# Patient Record
Sex: Female | Born: 1980 | Hispanic: No | Marital: Single | Smoking: Never smoker
Health system: Southern US, Community
[De-identification: ages and names within clinical notes are randomized; demographics above are authoritative.]

## PROBLEM LIST (undated history)

## (undated) ENCOUNTER — Encounter

## (undated) DIAGNOSIS — D649 Anemia, unspecified: Secondary | ICD-10-CM

## (undated) DIAGNOSIS — K219 Gastro-esophageal reflux disease without esophagitis: Secondary | ICD-10-CM

## (undated) HISTORY — PX: HERNIA REPAIR: SHX51

## (undated) HISTORY — DX: Gastro-esophageal reflux disease without esophagitis: K21.9

## (undated) HISTORY — DX: Anemia, unspecified: D64.9

---

## 2015-11-07 DIAGNOSIS — M542 Cervicalgia: Secondary | ICD-10-CM

## 2015-11-07 DIAGNOSIS — M79602 Pain in left arm: Secondary | ICD-10-CM

## 2015-11-07 DIAGNOSIS — H9202 Otalgia, left ear: Secondary | ICD-10-CM

## 2015-11-07 DIAGNOSIS — R202 Paresthesia of skin: Secondary | ICD-10-CM

## 2015-11-07 DIAGNOSIS — J029 Acute pharyngitis, unspecified: Secondary | ICD-10-CM

## 2015-11-07 DIAGNOSIS — M62838 Other muscle spasm: Principal | ICD-10-CM

## 2015-11-07 DIAGNOSIS — Z538 Procedure and treatment not carried out for other reasons: Secondary | ICD-10-CM

## 2015-11-07 DIAGNOSIS — K429 Umbilical hernia without obstruction or gangrene: Principal | ICD-10-CM

## 2015-11-08 ENCOUNTER — Inpatient Hospital Stay: Admit: 2015-11-08 | Discharge: 2015-11-08

## 2015-11-08 DIAGNOSIS — R202 Paresthesia of skin: Secondary | ICD-10-CM

## 2015-11-08 DIAGNOSIS — M542 Cervicalgia: Secondary | ICD-10-CM

## 2015-11-08 DIAGNOSIS — Z538 Procedure and treatment not carried out for other reasons: Secondary | ICD-10-CM

## 2015-11-08 DIAGNOSIS — H9202 Otalgia, left ear: Secondary | ICD-10-CM

## 2015-11-08 DIAGNOSIS — M62838 Other muscle spasm: Principal | ICD-10-CM

## 2015-11-08 DIAGNOSIS — M79602 Pain in left arm: Secondary | ICD-10-CM

## 2015-11-08 DIAGNOSIS — J029 Acute pharyngitis, unspecified: Secondary | ICD-10-CM

## 2015-11-08 DIAGNOSIS — K429 Umbilical hernia without obstruction or gangrene: Principal | ICD-10-CM

## 2015-11-08 MED ORDER — DIAZEPAM 5 MG PO TABS
5 mg | Freq: Every evening | ORAL | 0 refills | Status: CP | PRN
Start: 2015-11-08 — End: ?

## 2015-11-08 MED ORDER — IBUPROFEN 800 MG PO TABS
Freq: Four times a day (QID) | ORAL | PRN
Start: 2015-11-08 — End: 2015-11-08

## 2015-11-08 MED ORDER — IBUPROFEN 400 MG PO TABS
400 mg | Freq: Four times a day (QID) | ORAL | 0 refills | Status: CP | PRN
Start: 2015-11-08 — End: ?

## 2015-11-08 NOTE — ED Provider Notes
History     Chief Complaint   Patient presents with   ? Neck Pain   ? Facial Pain       The history is provided by the patient.       No Known Allergies    Patient's Medications   New Prescriptions    No medications on file   Previous Medications    IBUPROFEN (ADVIL,MOTRIN) 800 MG TABLET    Take by mouth every 6 hours as needed for pain.    ONDANSETRON (ZOFRAN) 4 MG TABLET    Take 1 tablet by mouth every 8 hours as needed.   Modified Medications    No medications on file   Discontinued Medications    No medications on file       Past Medical History:   Diagnosis Date   ? Umbilical hernia        Past Surgical History:   Procedure Laterality Date   ? CESAREAN SECTION     ? HERNIA REPAIR         No family history on file.    Social History     Social History   ? Marital status: Single     Spouse name: N/A   ? Number of children: N/A   ? Years of education: N/A     Social History Main Topics   ? Smoking status: Never Smoker   ? Smokeless tobacco: None   ? Alcohol use Yes   ? Drug use: No   ? Sexual activity: Not Asked     Other Topics Concern   ? None     Social History Narrative       Review of Systems    Physical Exam     ED Triage Vitals   BP 11/08/15 1146 122/83   Pulse 11/08/15 1146 72   Resp 11/08/15 1146 16   Temp 11/08/15 1146 36.6 ?C (97.9 ?F)   Temp src 11/08/15 1146 Oral   Height 11/08/15 1146 1.803 m   Weight 11/08/15 1146 85.7 kg   SpO2 11/08/15 1146 97 %   BMI (Calculated) 11/08/15 1146 26.42       BP 122/83 - Pulse 72 - Temp 36.6 ?C (97.9 ?F) (Oral)  - Resp 16 - Ht 1.803 m - Wt 85.7 kg - SpO2 97% - BMI 26.36 kg/m2    Physical Exam   Constitutional: She is oriented to person, place, and time. She appears well-developed and well-nourished.   Neurological: She is alert and oriented to person, place, and time. No cranial nerve deficit. She exhibits normal muscle tone. Coordination normal.   Nursing note and vitals reviewed.      Differential DDx: Neck spasm, Cervical disc herniation, Cord Compression,

## 2015-11-08 NOTE — ED Provider Notes
Migraine, Pharynguitis, Neck abscess, Patholocical fracture, others    Is this an Emergent Medical Condition? {SH ED EMERGENT MEDICAL [phone]}  409.901 FS  641.19 FS  627.732 (16) FS    ED Workup   Procedures    Labs:  - - No data to display      Imaging (Read by ED Provider):  {Imaging [phone]}    EKG (Read by ED Provider):  {EKG findings:[phone]}    MDM    ED Course & Re-Evaluation     ED Course   Patient evaluated. Neck exam significant for trappezius musc tenderness suggestive of muscle spasm. No neurological deficits found on exam. No cord complexion symptoms. No risks for pathological fractures. Reason for which no xrays will be send. Centor Criteria for Sore Throat score of 1 no need for further testing. Will provide NSAID and muscle relaxant for improvement of symptoms.   Blanchard KelchMaria Del Mar Josephina ShihMorales Hernandez, MD 1:14 PM 11/08/2015        ED Disposition   ED Disposition: No ED Disposition Set      ED Clinical Impression   ED Clinical Impression:   No Clinical Impression Set      ED Patient Status   Patient Status:   {SH ED JX PATIENT STATUS:[phone]}        ED Medical Evaluation Initiated   Medical Evaluation Initiated:   Yes, filed at 11/08/15 1238  by Rosilyn Mingsiaz, Joseph Daniel, MD

## 2015-11-08 NOTE — ED Provider Notes
hours as needed for pain.Disp-30 tablet, R-0, Normal         CONTINUE these medications which have NOT CHANGED    Details   ondansetron (ZOFRAN) 4 MG Tablet Take 1 tablet by mouth every 8 hours as needed.Disp-15 tablet, R-0, Normal             Past Medical History:   Diagnosis Date   ? Umbilical hernia        Past Surgical History:   Procedure Laterality Date   ? CESAREAN SECTION     ? HERNIA REPAIR         No family history on file.    Social History     Social History   ? Marital status: Single     Spouse name: N/A   ? Number of children: N/A   ? Years of education: N/A     Social History Main Topics   ? Smoking status: Never Smoker   ? Smokeless tobacco: None   ? Alcohol use Yes   ? Drug use: No   ? Sexual activity: Not Asked     Other Topics Concern   ? None     Social History Narrative       Review of Systems   Constitutional: Negative for fever, chills and diaphoresis.   HENT: Positive for ear pain, sore throat and neck pain. Negative for nasal congestion, nasal discharge, drooling and ear discharge.    Eyes: Negative for pain, discharge and visual disturbance.   Respiratory: Negative for cough, chest tightness and shortness of breath.    Cardiovascular: Negative for chest pain, palpitations and leg swelling.   Gastrointestinal: Negative for nausea, vomiting, abdominal pain, diarrhea, constipation and blood in stool.   Genitourinary: Negative for dysuria, frequency, hematuria, decreased urine volume, vaginal discharge, enuresis and difficulty urinating.   Musculoskeletal: Positive for myalgias, back pain and arthralgias. Negative for joint swelling and gait problem.   Neurological: Positive for tingling, numbness (tongue numbness) and headaches. Negative for weakness.       Physical Exam       ED Triage Vitals   BP 11/08/15 1146 122/83   Pulse 11/08/15 1146 72   Resp 11/08/15 1146 16   Temp 11/08/15 1146 36.6 ?C (97.9 ?F)   Temp src 11/08/15 1146 Oral   Height 11/08/15 1146 1.803 m

## 2015-11-08 NOTE — ED Provider Notes
History     Chief Complaint   Patient presents with   ? Neck Pain   ? Facial Pain       HPI Comments: A 35 y/o F with history of migraines that comes in due to 1 month history of neck pain. Pain is described as stiffenes and heavyness in the left side of her neck radiating towards her left arm and left side of her face. She refers it is gradually getting worse. It gets worst with movement. She tried NSAID with some relief but yesterday her pain was intensified and associated with migraine type headache, pain in her left ear, and a numbness feeling in her left side of her face and tongue. Patient also refers sore throat since yesterday. She denies SOB, cough, chest pain, odynophagia, dysphagia or difficulty breathing, facial deviation, ear discharges, nasal congestion and nasal discharges.  She denies recent sick contacts, heavy lifting or trauma to the area.     Patient is a 35 y.o. female presenting with neck pain. The history is provided by the patient.   Neck Pain   Pain location:  L side  Quality:  Stiffness  Stiffness is present:  All day  Pain radiates to:  L arm and head (face)  Pain severity:  Severe  Pain is:  Same all the time  Duration:  1 month  Timing:  Constant  Progression:  Worsening  Context: not fall, not lifting a heavy object, not pedestrian accident and not recent injury    Relieved by:  NSAIDs  Exacerbated by: movement.  Ineffective treatments:  Ice  Associated symptoms: headaches, numbness (tongue numbness) and tingling    Associated symptoms: no chest pain, no fever and no weakness        No Known Allergies    Discharge Medication List as of 11/08/2015  1:39 PM      START taking these medications    Details   diazePAM (VALIUM) 5 MG Tablet Take 1 tablet by mouth nightly at bedtime as needed for muscle spasms.Disp-15 tablet, R-0, Normal         CONTINUE these medications which have CHANGED    Details   ibuprofen (ADVIL,MOTRIN) 400 MG Tablet Take 1 tablet by mouth every 6

## 2015-11-08 NOTE — ED Provider Notes
- -   No data to display      Imaging (Read by ED Provider):  not applicable    EKG (Read by ED Provider):  not applicable    MDM  Number of Diagnoses or Management Options  Muscle spasm:      Amount and/or Complexity of Data Reviewed  Discussion of test results with the performing providers: no  Decide to obtain previous medical records or to obtain history from someone other than the patient: no  Obtain history from someone other than the patient: no  Review and summarize past medical records: yes  Discuss the patient with other providers: no  Independent visualization of images, tracings, or specimens: no        ED Course & Re-Evaluation     ED Course   Patient evaluated. Neck exam significant for trappezius musc tenderness suggestive of muscle spasm. No neurological deficits found on exam. No cord complexion symptoms. No risks for pathological fractures. Reason for which no xrays will be send. Centor Criteria for Sore Throat score of 1 no need for further testing. Will provide NSAID and muscle relaxant for improvement of symptoms.   Dawn KelchMaria Del Mar Josephina ShihMorales Hernandez, MD 1:14 PM 11/08/2015        ED Disposition   ED Disposition: Discharge      ED Clinical Impression   ED Clinical Impression:   Muscle spasm      ED Patient Status   Patient Status:   Good        ED Medical Evaluation Initiated   Medical Evaluation Initiated:   Yes, filed at 11/08/15 1238  by Rosilyn Mingsiaz, Joseph Daniel, MD             Josephina ShihMorales Yu, Stacey DrainMaria Del Mar, MD  Resident  11/08/15 1725

## 2015-11-08 NOTE — ED Notes
Dc home  Verb dc instruction  Amb  Rx x2  Work note

## 2015-11-08 NOTE — ED Provider Notes
Weight 11/08/15 1146 85.7 kg   SpO2 11/08/15 1146 97 %   BMI (Calculated) 11/08/15 1146 26.42       BP 122/83 - Pulse 72 - Temp 36.6 ?C (97.9 ?F) (Oral)  - Resp 16 - Ht 1.803 m - Wt 85.7 kg - SpO2 97% - BMI 26.36 kg/m2    Physical Exam   Constitutional: She is oriented to person, place, and time. She appears well-developed and well-nourished.   HENT:   Head: Normocephalic and atraumatic.   Right Ear: External ear normal.   Left Ear: External ear normal.   Nose: Nose normal.   Mouth/Throat: No oropharyngeal exudate.   Ext ear canal without erythema or exudates B/L  Tympanic membranes intact B/L     Eyes: Conjunctivae and EOM are normal. Pupils are equal, round, and reactive to light. Right eye exhibits no discharge. Left eye exhibits no discharge.   Neck: Normal range of motion. Neck supple. No JVD present. No tracheal deviation present. No thyromegaly present.   Cardiovascular: Normal rate, regular rhythm and normal heart sounds.    Pulmonary/Chest: Effort normal and breath sounds normal. No stridor. No respiratory distress. She has no wheezes. She exhibits no tenderness.   Abdominal: Soft. Bowel sounds are normal. She exhibits no distension and no mass. There is no tenderness. There is no rebound and no guarding.   Musculoskeletal: Normal range of motion. She exhibits tenderness. She exhibits no edema or deformity.        Arms:  Lymphadenopathy:     She has no cervical adenopathy.   Neurological: She is alert and oriented to person, place, and time. No cranial nerve deficit. She exhibits normal muscle tone. Coordination normal.   Skin: No rash noted. No erythema. No pallor.   Nursing note and vitals reviewed.      Differential DDx: Neck spasm, Cervical disc herniation, Cord Compression, Migraine, Pharynguitis, Neck abscess, Patholocical fracture, others    Is this an Emergent Medical Condition? Yes - Severe Pain/Acute Onset of Symptons  409.901 FS  641.19 FS  627.732 (16) FS    ED Workup   Procedures    Labs:

## 2015-11-08 NOTE — ED Provider Notes
History     Chief Complaint   Patient presents with   ? Neck Pain   ? Facial Pain       The history is provided by the patient.       No Known Allergies    Patient's Medications   New Prescriptions    No medications on file   Previous Medications    IBUPROFEN (ADVIL,MOTRIN) 800 MG TABLET    Take by mouth every 6 hours as needed for pain.    ONDANSETRON (ZOFRAN) 4 MG TABLET    Take 1 tablet by mouth every 8 hours as needed.   Modified Medications    No medications on file   Discontinued Medications    No medications on file       Past Medical History:   Diagnosis Date   ? Umbilical hernia        Past Surgical History:   Procedure Laterality Date   ? CESAREAN SECTION     ? HERNIA REPAIR         No family history on file.    Social History     Social History   ? Marital status: Single     Spouse name: N/A   ? Number of children: N/A   ? Years of education: N/A     Social History Main Topics   ? Smoking status: Never Smoker   ? Smokeless tobacco: None   ? Alcohol use Yes   ? Drug use: No   ? Sexual activity: Not Asked     Other Topics Concern   ? None     Social History Narrative       Review of Systems    Physical Exam     ED Triage Vitals   BP 11/08/15 1146 122/83   Pulse 11/08/15 1146 72   Resp 11/08/15 1146 16   Temp 11/08/15 1146 36.6 ?C (97.9 ?F)   Temp src 11/08/15 1146 Oral   Height 11/08/15 1146 1.803 m   Weight 11/08/15 1146 85.7 kg   SpO2 11/08/15 1146 97 %   BMI (Calculated) 11/08/15 1146 26.42       BP 122/83 - Pulse 72 - Temp 36.6 ?C (97.9 ?F) (Oral)  - Resp 16 - Ht 1.803 m - Wt 85.7 kg - SpO2 97% - BMI 26.36 kg/m2    Physical Exam   Constitutional: She is oriented to person, place, and time. She appears well-developed and well-nourished.   Neurological: She is alert and oriented to person, place, and time. No cranial nerve deficit. She exhibits normal muscle tone. Coordination normal.   Nursing note and vitals reviewed.      Differential DDx: ***

## 2015-11-08 NOTE — ED Notes
BF ambulatory to triage with CC of neck pain. Sts pain started one month ago woke this morning with a numb tongue. Sts tongue is partially numb in the back. Sts she was nauseous and vomitted 1x this am. Sts she worked all day and decided to come to ED after. Denies additional neuro symptoms at this time. To flex for eval.

## 2015-11-08 NOTE — ED Notes
Pt ambulates to triage with steady gait with c/o L sided neck and facial pain. Pt states that the neck pain started about 1 week ago and she thought that she had just slept wrong. States that she has treating at home with heat/ice and OTC Ibuprofen. Pt states minimal relief with these measures and then when she woke up yesterday she had pain to the L side of her face and head, sore throat and intermittent tongue numbness on the L side so she came in to be evaluated.

## 2015-11-08 NOTE — ED Provider Notes
Is this an Emergent Medical Condition? {SH ED EMERGENT MEDICAL CONDITION:[id number]}  409.901 FS  641.19 FS  627.732 (16) FS    ED Workup   Procedures    Labs:  - - No data to display      Imaging (Read by ED Provider):  {Imaging findings:[phone]}    EKG (Read by ED Provider):  {EKG [phone]}    MDM    ED Course & Re-Evaluation     ED Course         ED Disposition   ED Disposition: No ED Disposition Set      ED Clinical Impression   ED Clinical Impression:   No Clinical Impression Set      ED Patient Status   [facility]:   [phone][facility]        ED Medical Evaluation Initiated   Medical Evaluation Initiated:   Yes, filed at 11/08/15 1238  by Rosilyn Mingsiaz, Joseph Daniel, MD

## 2019-08-02 ENCOUNTER — Emergency Department (HOSPITAL_COMMUNITY)
Admission: EM | Admit: 2019-08-02 | Discharge: 2019-08-02 | Disposition: A | Discharge status: Home / Self Care | Payer: Medicaid Other | Attending: Emergency Medicine | Admitting: Emergency Medicine

## 2019-08-02 ENCOUNTER — Other Ambulatory Visit: Payer: Self-pay

## 2019-08-02 ENCOUNTER — Encounter (HOSPITAL_COMMUNITY): Payer: Self-pay | Admitting: Emergency Medicine

## 2019-08-02 ENCOUNTER — Emergency Department (HOSPITAL_COMMUNITY): Payer: Medicaid Other

## 2019-08-02 DIAGNOSIS — Y929 Unspecified place or not applicable: Secondary | ICD-10-CM | POA: Insufficient documentation

## 2019-08-02 DIAGNOSIS — Y9389 Activity, other specified: Secondary | ICD-10-CM | POA: Insufficient documentation

## 2019-08-02 DIAGNOSIS — X58XXXA Exposure to other specified factors, initial encounter: Secondary | ICD-10-CM | POA: Insufficient documentation

## 2019-08-02 DIAGNOSIS — S92344A Nondisplaced fracture of fourth metatarsal bone, right foot, initial encounter for closed fracture: Secondary | ICD-10-CM | POA: Diagnosis not present

## 2019-08-02 DIAGNOSIS — Y999 Unspecified external cause status: Secondary | ICD-10-CM | POA: Insufficient documentation

## 2019-08-02 DIAGNOSIS — S99921A Unspecified injury of right foot, initial encounter: Secondary | ICD-10-CM | POA: Diagnosis present
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
## 2019-08-02 NOTE — Discharge Instructions (Addendum)
Wear boot, apply ice for 20 minutes at a time three times daily and elevate. Take Motrin and Tylenol as needed as directed for pain. Follow up with orthopedics.

## 2019-08-02 NOTE — ED Notes (Signed)
Patient verbalizes understanding of discharge instructions. Opportunity for questioning and answers were provided. Armband removed by staff, pt discharged from ED ambulatory to home.  

## 2020-03-11 ENCOUNTER — Telehealth: Payer: Self-pay | Admitting: Hematology and Oncology

## 2020-03-11 NOTE — Telephone Encounter (Signed)
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 Holly Sellers has been cld and scheduled to see Dr. Leonides Schanz on 12/10 at 9am. Pt aware to arrive30 minutes early.

## 2020-04-03 ENCOUNTER — Telehealth: Payer: Self-pay | Admitting: *Deleted
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
## 2020-04-04 ENCOUNTER — Inpatient Hospital Stay: Payer: Medicaid Other

## 2020-04-04 ENCOUNTER — Other Ambulatory Visit: Payer: Self-pay

## 2020-04-04 ENCOUNTER — Encounter: Payer: Self-pay | Admitting: Hematology and Oncology

## 2020-04-04 ENCOUNTER — Inpatient Hospital Stay: Payer: Medicaid Other | Attending: Hematology and Oncology | Admitting: Hematology and Oncology

## 2020-04-04 VITALS — BP 102/80 | HR 64 | Temp 97.3°F | Resp 20 | Wt 150.3 lb

## 2020-04-04 DIAGNOSIS — M069 Rheumatoid arthritis, unspecified: Secondary | ICD-10-CM | POA: Diagnosis not present

## 2020-04-04 DIAGNOSIS — D500 Iron deficiency anemia secondary to blood loss (chronic): Secondary | ICD-10-CM

## 2020-04-04 DIAGNOSIS — Z0000 Encounter for general adult medical examination without abnormal findings: Secondary | ICD-10-CM | POA: Diagnosis not present

## 2020-04-04 DIAGNOSIS — D696 Thrombocytopenia, unspecified: Secondary | ICD-10-CM | POA: Diagnosis present

## 2020-04-04 DIAGNOSIS — D509 Iron deficiency anemia, unspecified: Secondary | ICD-10-CM | POA: Diagnosis not present

## 2020-04-04 LAB — PROTIME-INR
INR: 1.1 (ref 0.8–1.2)
Prothrombin Time: 13.7 seconds (ref 11.4–15.2)

## 2020-04-04 LAB — IRON AND TIBC
Iron: 123 ug/dL (ref 41–142)
Saturation Ratios: 30 % (ref 21–57)
TIBC: 415 ug/dL (ref 236–444)
UIBC: 292 ug/dL (ref 120–384)

## 2020-04-04 LAB — CBC WITH DIFFERENTIAL (CANCER CENTER ONLY)
Abs Immature Granulocytes: 0.01 10*3/uL (ref 0.00–0.07)
Basophils Absolute: 0 10*3/uL (ref 0.0–0.1)
Basophils Relative: 1 %
Eosinophils Absolute: 0 10*3/uL (ref 0.0–0.5)
Eosinophils Relative: 1 %
HCT: 40.7 % (ref 36.0–46.0)
Hemoglobin: 12.9 g/dL (ref 12.0–15.0)
Immature Granulocytes: 0 %
Lymphocytes Relative: 49 %
Lymphs Abs: 2.1 10*3/uL (ref 0.7–4.0)
MCH: 26.2 pg (ref 26.0–34.0)
MCHC: 31.7 g/dL (ref 30.0–36.0)
MCV: 82.7 fL (ref 80.0–100.0)
Monocytes Absolute: 0.5 10*3/uL (ref 0.1–1.0)
Monocytes Relative: 12 %
Neutro Abs: 1.6 10*3/uL — ABNORMAL LOW (ref 1.7–7.7)
Neutrophils Relative %: 37 %
Platelet Count: 82 10*3/uL — ABNORMAL LOW (ref 150–400)
RBC: 4.92 MIL/uL (ref 3.87–5.11)
RDW: 15.1 % (ref 11.5–15.5)
WBC Count: 4.2 10*3/uL (ref 4.0–10.5)
nRBC: 0 % (ref 0.0–0.2)

## 2020-04-04 LAB — CMP (CANCER CENTER ONLY)
ALT: 15 U/L (ref 0–44)
AST: 23 U/L (ref 15–41)
Albumin: 4.3 g/dL (ref 3.5–5.0)
Alkaline Phosphatase: 66 U/L (ref 38–126)
Anion gap: 11 (ref 5–15)
BUN: 14 mg/dL (ref 6–20)
CO2: 24 mmol/L (ref 22–32)
Calcium: 9.9 mg/dL (ref 8.9–10.3)
Chloride: 102 mmol/L (ref 98–111)
Creatinine: 1.17 mg/dL — ABNORMAL HIGH (ref 0.44–1.00)
GFR, Estimated: >60 mL/min (ref >60)
Glucose, Bld: 81 mg/dL (ref 70–99)
Potassium: 3.7 mmol/L (ref 3.5–5.1)
Sodium: 137 mmol/L (ref 135–145)
Total Bilirubin: 0.7 mg/dL (ref 0.3–1.2)
Total Protein: 8.5 g/dL — ABNORMAL HIGH (ref 6.5–8.1)

## 2020-04-04 LAB — APTT: aPTT: 33 seconds (ref 24–36)

## 2020-04-04 LAB — IMMATURE PLATELET FRACTION: Immature Platelet Fraction: 25.4 % — ABNORMAL HIGH (ref 1.2–8.6)

## 2020-04-04 LAB — PLATELET BY CITRATE

## 2020-04-04 LAB — VITAMIN B12: Vitamin B-12: 176 pg/mL — ABNORMAL LOW (ref 180–914)

## 2020-04-04 LAB — FOLATE: Folate: 10 ng/mL (ref >5.9)

## 2020-04-04 LAB — SAVE SMEAR(SSMR), FOR PROVIDER SLIDE REVIEW

## 2020-04-04 LAB — LACTATE DEHYDROGENASE: LDH: 152 U/L (ref 98–192)

## 2020-04-04 LAB — FERRITIN: Ferritin: 24 ng/mL (ref 11–307)
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
## 2020-04-05 LAB — HOMOCYSTEINE: Homocysteine: 13.4 umol/L (ref 0.0–14.5)

## 2020-04-09 LAB — METHYLMALONIC ACID, SERUM: Methylmalonic Acid, Quantitative: 165 nmol/L (ref 0–378)

## 2020-04-14 ENCOUNTER — Other Ambulatory Visit: Payer: Self-pay | Admitting: *Deleted

## 2020-04-14 ENCOUNTER — Telehealth: Payer: Self-pay | Admitting: *Deleted

## 2020-04-14 MED ORDER — VITAMIN B-12 1000 MCG PO TABS: 1000.0000 ug | ORAL_TABLET | Freq: Every day | ORAL | 3 refills | Status: DC

## 2020-04-14 NOTE — Telephone Encounter (Signed)
TCT patient to review lab results with her.  Spoke with pt and advised that her B12 levels are low.Advised that Dr. Leonides Schanz recommends supplemental B12 that will be called in to her pharmacy. Pt voiced understanding and provided name of pharmacy. Vitamin B12 was escribed to her pharmacy.

## 2020-04-14 NOTE — Telephone Encounter (Signed)
-----   Message from Jaci Standard, MD sent at 04/10/2020  4:51 PM EST ----- Please let Holly Sellers know that her labs show a modestly low vitamin b12 level. Please call in of vitamin b12 PO to a pharmacy of her choice.  We will see her back in 3 months to re-evaluate.   ----- Message ----- From: Interface, Lab In [place] Sent: 04/04/2020  10:52 AM EST To: Jaci Standard, MD

## 2020-06-12 ENCOUNTER — Ambulatory Visit: Payer: Medicaid Other | Admitting: Nurse Practitioner

## 2020-06-12 ENCOUNTER — Other Ambulatory Visit: Payer: Self-pay

## 2020-06-12 ENCOUNTER — Encounter: Payer: Self-pay | Admitting: Nurse Practitioner

## 2020-06-12 VITALS — BP 118/70 | HR 75 | Temp 98.3°F | Ht 68.8 in | Wt 154.6 lb

## 2020-06-12 DIAGNOSIS — M25512 Pain in left shoulder: Secondary | ICD-10-CM | POA: Diagnosis not present

## 2020-06-12 DIAGNOSIS — M542 Cervicalgia: Secondary | ICD-10-CM | POA: Diagnosis not present

## 2020-06-12 DIAGNOSIS — G8929 Other chronic pain: Secondary | ICD-10-CM

## 2020-06-12 DIAGNOSIS — D649 Anemia, unspecified: Secondary | ICD-10-CM

## 2020-06-12 DIAGNOSIS — R531 Weakness: Secondary | ICD-10-CM | POA: Diagnosis not present

## 2020-06-12 DIAGNOSIS — Z1159 Encounter for screening for other viral diseases: Secondary | ICD-10-CM

## 2020-06-12 DIAGNOSIS — Z114 Encounter for screening for human immunodeficiency virus [HIV]: Secondary | ICD-10-CM

## 2020-06-12 DIAGNOSIS — Z8739 Personal history of other diseases of the musculoskeletal system and connective tissue: Secondary | ICD-10-CM

## 2020-06-12 DIAGNOSIS — Z7689 Persons encountering health services in other specified circumstances: Secondary | ICD-10-CM
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
## 2020-06-13 LAB — CBC
Hematocrit: 37.5 % (ref 34.0–46.6)
Hemoglobin: 12.2 g/dL (ref 11.1–15.9)
MCH: 26.3 pg — ABNORMAL LOW (ref 26.6–33.0)
MCHC: 32.5 g/dL (ref 31.5–35.7)
MCV: 81 fL (ref 79–97)
Platelets: 131 10*3/uL — ABNORMAL LOW (ref 150–450)
RBC: 4.64 x10E6/uL (ref 3.77–5.28)
RDW: 15.1 % (ref 11.7–15.4)
WBC: 4.2 10*3/uL (ref 3.4–10.8)

## 2020-06-13 LAB — HIV ANTIBODY (ROUTINE TESTING W REFLEX): HIV Screen 4th Generation wRfx: NONREACTIVE

## 2020-06-13 LAB — HEPATITIS C ANTIBODY: Hep C Virus Ab: <0.1 s/co ratio (ref 0.0–0.9)

## 2020-06-13 LAB — AUTOIMMUNE PROFILE
Anti Nuclear Antibody (ANA): NEGATIVE
Complement C3, Serum: 113 mg/dL (ref 82–167)
dsDNA Ab: <1 IU/mL (ref 0–9)

## 2020-06-13 LAB — ANTINUCLEAR ANTIBODIES, IFA: ANA Titer 1: NEGATIVE

## 2020-06-13 LAB — SEDIMENTATION RATE: Sed Rate: 9 mm/hr (ref 0–32)

## 2020-06-23 ENCOUNTER — Encounter: Payer: Self-pay | Admitting: Nurse Practitioner

## 2020-07-03 ENCOUNTER — Other Ambulatory Visit: Payer: Self-pay | Admitting: Hematology and Oncology

## 2020-07-03 ENCOUNTER — Inpatient Hospital Stay: Payer: Medicaid Other

## 2020-07-03 ENCOUNTER — Inpatient Hospital Stay: Payer: Medicaid Other | Attending: Hematology and Oncology | Admitting: Hematology and Oncology

## 2020-07-03 DIAGNOSIS — D696 Thrombocytopenia, unspecified: Secondary | ICD-10-CM

## 2020-08-07 ENCOUNTER — Ambulatory Visit: Payer: Medicaid Other | Admitting: Nurse Practitioner
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
## 2022-03-12 ENCOUNTER — Ambulatory Visit (HOSPITAL_COMMUNITY): Payer: Self-pay

## 2022-04-05 ENCOUNTER — Ambulatory Visit: Payer: Medicaid Other | Admitting: Nurse Practitioner

## 2022-05-04 ENCOUNTER — Encounter: Payer: Self-pay | Admitting: Nurse Practitioner

## 2022-05-04 ENCOUNTER — Ambulatory Visit (INDEPENDENT_AMBULATORY_CARE_PROVIDER_SITE_OTHER): Payer: Medicaid Other | Admitting: Nurse Practitioner

## 2022-05-04 VITALS — BP 122/72 | HR 79 | Temp 98.1°F | Ht 68.0 in | Wt 163.2 lb

## 2022-05-04 DIAGNOSIS — M25511 Pain in right shoulder: Secondary | ICD-10-CM | POA: Diagnosis not present

## 2022-05-04 DIAGNOSIS — Z23 Encounter for immunization: Secondary | ICD-10-CM | POA: Diagnosis not present

## 2022-05-04 DIAGNOSIS — M069 Rheumatoid arthritis, unspecified: Secondary | ICD-10-CM | POA: Diagnosis not present

## 2022-05-04 DIAGNOSIS — E611 Iron deficiency: Secondary | ICD-10-CM | POA: Diagnosis not present

## 2022-05-04 MED ORDER — KETOROLAC TROMETHAMINE 60 MG/2ML IM SOLN
60.0000 mg | Freq: Once | INTRAMUSCULAR | Status: AC
  Administered 2022-05-04: 60 mg via INTRAMUSCULAR
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
## 2022-05-04 NOTE — Patient Instructions (Signed)

## 2022-05-05 ENCOUNTER — Other Ambulatory Visit: Payer: Self-pay | Admitting: Nurse Practitioner

## 2022-05-05 LAB — CMP14+EGFR
ALT: 16 IU/L (ref 0–32)
AST: 25 IU/L (ref 0–40)
Albumin/Globulin Ratio: 1.4 (ref 1.2–2.2)
Albumin: 4 g/dL (ref 3.9–4.9)
Alkaline Phosphatase: 55 IU/L (ref 44–121)
BUN/Creatinine Ratio: 12 (ref 9–23)
BUN: 13 mg/dL (ref 6–20)
Bilirubin Total: 0.2 mg/dL (ref 0.0–1.2)
CO2: 23 mmol/L (ref 20–29)
Calcium: 9.2 mg/dL (ref 8.7–10.2)
Chloride: 104 mmol/L (ref 96–106)
Creatinine, Ser: 1.08 mg/dL — ABNORMAL HIGH (ref 0.57–1.00)
Globulin, Total: 2.9 g/dL (ref 1.5–4.5)
Glucose: 69 mg/dL — ABNORMAL LOW (ref 70–99)
Potassium: 4.4 mmol/L (ref 3.5–5.2)
Sodium: 139 mmol/L (ref 134–144)
Total Protein: 6.9 g/dL (ref 6.0–8.5)
eGFR: 67 mL/min/{1.73_m2} (ref >59)

## 2022-05-05 LAB — CBC
Hematocrit: 39.2 % (ref 34.0–46.6)
Hemoglobin: 12.6 g/dL (ref 11.1–15.9)
MCH: 27.3 pg (ref 26.6–33.0)
MCHC: 32.1 g/dL (ref 31.5–35.7)
MCV: 85 fL (ref 79–97)
Platelets: 109 10*3/uL — ABNORMAL LOW (ref 150–450)
RBC: 4.61 x10E6/uL (ref 3.77–5.28)
RDW: 15.1 % (ref 11.7–15.4)
WBC: 3.7 10*3/uL (ref 3.4–10.8)

## 2022-05-05 LAB — IRON,TIBC AND FERRITIN PANEL
Ferritin: 10 ng/mL — ABNORMAL LOW (ref 15–150)
Iron Saturation: 11 % — ABNORMAL LOW (ref 15–55)
Iron: 42 ug/dL (ref 27–159)
Total Iron Binding Capacity: 391 ug/dL (ref 250–450)
UIBC: 349 ug/dL (ref 131–425)

## 2022-05-18 ENCOUNTER — Ambulatory Visit: Payer: Self-pay

## 2022-05-18 ENCOUNTER — Ambulatory Visit (INDEPENDENT_AMBULATORY_CARE_PROVIDER_SITE_OTHER): Payer: Medicaid Other | Admitting: Sports Medicine

## 2022-05-18 ENCOUNTER — Ambulatory Visit (INDEPENDENT_AMBULATORY_CARE_PROVIDER_SITE_OTHER): Payer: Medicaid Other | Admitting: Orthopaedic Surgery

## 2022-05-18 ENCOUNTER — Ambulatory Visit (INDEPENDENT_AMBULATORY_CARE_PROVIDER_SITE_OTHER): Payer: Medicaid Other

## 2022-05-18 DIAGNOSIS — G8929 Other chronic pain: Secondary | ICD-10-CM

## 2022-05-18 DIAGNOSIS — M25511 Pain in right shoulder: Secondary | ICD-10-CM | POA: Diagnosis not present

## 2022-05-18 MED ORDER — METHYLPREDNISOLONE ACETATE 40 MG/ML IJ SUSP
80.0000 mg | INTRAMUSCULAR | Status: AC | PRN
  Administered 2022-05-18: 80 mg via INTRA_ARTICULAR

## 2022-05-18 MED ORDER — BUPIVACAINE HCL 0.25 % IJ SOLN
2.0000 mL | INTRAMUSCULAR | Status: AC | PRN
  Administered 2022-05-18: 2 mL via INTRA_ARTICULAR

## 2022-05-18 MED ORDER — LIDOCAINE HCL 1 % IJ SOLN
2.0000 mL | INTRAMUSCULAR | Status: AC | PRN
  Administered 2022-05-18: 2 mL
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
## 2022-05-18 NOTE — Progress Notes (Signed)
   Procedure Note  Patient: Holly Sellers             Date of Birth: 1980-08-04           MRN: [id number]             Visit Date: 05/18/2022  Procedures: Visit Diagnoses:  1. Chronic right shoulder pain    Large Joint Inj: R glenohumeral on 05/18/2022 10:23 AM Indications: pain Details: 22 G 3.5 in needle, ultrasound-guided posterior approach Medications: 2 mL lidocaine 1 %; 2 mL bupivacaine 0.25 %; 80 mg methylPREDNISolone acetate 40 MG/ML Outcome: tolerated well, no immediate complications  US-guided glenohumeral joint injection, right shoulder After discussion on risks/benefits/indications, informed verbal consent was obtained. A timeout was then performed. The patient was positioned lying lateral recumbent on examination table. The patient's shoulder was prepped with betadine and multiple alcohol swabs and utilizing ultrasound guidance, the patient's glenohumeral joint was identified on ultrasound. Using ultrasound guidance a 22-gauge, 3.5 inch needle with a mixture of 2:2:2 cc's lidocaine:bupivicaine:depomedrol was directed from a lateral to medial direction via in-plane technique into the glenohumeral joint with visualization of appropriate spread of injectate into the joint. Patient tolerated the procedure well without immediate complications.      Procedure, treatment alternatives, risks and benefits explained, specific risks discussed. Consent was given by the patient. Immediately prior to procedure a time out was called to verify the correct patient, procedure, equipment, support staff and site/side marked as required. Patient was prepped and draped in the usual sterile fashion.     - I evaluated the patient about 10 minutes post-injection and she had excellent improvement in pain and range of motion - follow-up with Dr. Erlinda Hong as indicated; I am happy to see them as needed  Elba Barman, DO [place]  This note was  dictated using Dragon naturally speaking software and may contain errors in syntax, spelling, or content which have not been identified prior to signing this note.

## 2022-08-31 ENCOUNTER — Ambulatory Visit: Payer: Medicaid Other | Admitting: Orthopaedic Surgery

## 2022-09-02 ENCOUNTER — Ambulatory Visit (INDEPENDENT_AMBULATORY_CARE_PROVIDER_SITE_OTHER): Payer: Medicaid Other | Admitting: Nurse Practitioner

## 2022-09-02 ENCOUNTER — Encounter: Payer: Self-pay | Admitting: Nurse Practitioner

## 2022-09-02 ENCOUNTER — Other Ambulatory Visit (HOSPITAL_COMMUNITY)
Admission: RE | Admit: 2022-09-02 | Discharge: 2022-09-02 | Disposition: A | Discharge status: Home / Self Care | Payer: Medicaid Other | Source: Ambulatory Visit | Attending: Nurse Practitioner | Admitting: Nurse Practitioner

## 2022-09-02 VITALS — BP 124/80 | HR 74 | Temp 98.4°F | Ht 68.0 in | Wt 163.6 lb

## 2022-09-02 DIAGNOSIS — M069 Rheumatoid arthritis, unspecified: Secondary | ICD-10-CM

## 2022-09-02 DIAGNOSIS — Z0000 Encounter for general adult medical examination without abnormal findings: Secondary | ICD-10-CM | POA: Diagnosis not present

## 2022-09-02 DIAGNOSIS — Z114 Encounter for screening for human immunodeficiency virus [HIV]: Secondary | ICD-10-CM

## 2022-09-02 DIAGNOSIS — Z13220 Encounter for screening for lipoid disorders: Secondary | ICD-10-CM

## 2022-09-02 DIAGNOSIS — Z113 Encounter for screening for infections with a predominantly sexual mode of transmission: Secondary | ICD-10-CM | POA: Diagnosis not present

## 2022-09-02 DIAGNOSIS — E611 Iron deficiency: Secondary | ICD-10-CM

## 2022-09-02 DIAGNOSIS — Z124 Encounter for screening for malignant neoplasm of cervix: Secondary | ICD-10-CM | POA: Insufficient documentation

## 2022-09-02 DIAGNOSIS — R2232 Localized swelling, mass and lump, left upper limb: Secondary | ICD-10-CM | POA: Diagnosis not present

## 2022-09-02 DIAGNOSIS — Z1159 Encounter for screening for other viral diseases: Secondary | ICD-10-CM

## 2022-09-02 DIAGNOSIS — Z28310 Unvaccinated for covid-19: Secondary | ICD-10-CM

## 2022-09-02 DIAGNOSIS — Z2821 Immunization not carried out because of patient refusal: Secondary | ICD-10-CM

## 2022-09-02 NOTE — Patient Instructions (Signed)

## 2022-09-02 NOTE — Progress Notes (Signed)
Holly Sellers,acting as a Neurosurgeon for Holly Felts, FNP.,have documented all relevant documentation on the behalf of Holly Felts, FNP,as directed by  Holly Felts, FNP while in the presence of Holly Felts, FNP.   Subjective:     Patient ID: Holly Sellers , female    DOB: [birth year] , 42 y.o.   MRN: [id number]   Chief Complaint  Patient presents with   Annual Exam    HPI  Patient presents today for HM, Patient states compliance with medications and has no other concerns.  BP Readings from Last 3 Encounters: 09/02/22 : 124/80 05/04/22 : 122/72 06/12/20 : 118/70       Past Medical History:  Diagnosis Date   Anemia    GERD (gastroesophageal reflux disease)      Family History  Problem Relation Age of Onset   Hypertension Mother    Diabetes Mother    Hyperlipidemia Mother    Anemia Sister    Diabetes Sister      Current Outpatient Medications:    buPROPion (WELLBUTRIN XL) 150 MG 24 hr tablet, Take 1 tablet by mouth daily. (Patient not taking: Reported on 09/02/2022), Disp: , Rfl:    cyclobenzaprine (FLEXERIL) 5 MG tablet, Take by mouth. (Patient not taking: Reported on 09/02/2022), Disp: , Rfl:    No Known Allergies    The patient states she uses tubal ligation for birth control.  Patient's last menstrual period was 08/27/2022 (approximate).  Negative for Dysmenorrhea and Negative for Menorrhagia. Negative for: breast discharge, breast lump(s), breast pain and breast self exam. Associated symptoms include abnormal vaginal bleeding. Pertinent negatives include abnormal bleeding (hematology), anxiety, decreased libido, depression, difficulty falling sleep, dyspareunia, history of infertility, nocturia, sexual dysfunction, sleep disturbances, urinary incontinence, urinary urgency, vaginal discharge and vaginal itching. Diet regular - vegetables and fruits, eats healthy snacks.  The patient states her exercise level is none.   The patient's tobacco use is:  Social History    Tobacco Use  Smoking Status Never  Smokeless Tobacco Never  She has been exposed to passive smoke. The patient's alcohol use is:  Social History   Substance and Sexual Activity  Alcohol Use Yes   Comment: occasional weekends  Additional information: Last pap unknown, next one scheduled for today.    Review of Systems  Constitutional: Negative.   Respiratory: Negative.    Cardiovascular: Negative.   Neurological: Negative.   Psychiatric/Behavioral: Negative.       Today's Vitals   09/02/22 1136  BP: 124/80  Pulse: 74  Temp: 98.4 F (36.9 C)  TempSrc: Oral  Weight: 163 lb 9.6 oz (74.2 kg)  Height: 5\' 8"  (1.727 m)   Body mass index is 24.88 kg/m.  Wt Readings from Last 3 Encounters:  09/02/22 163 lb 9.6 oz (74.2 kg)  05/04/22 163 lb 3.2 oz (74 kg)  06/12/20 154 lb 9.6 oz (70.1 kg)    Objective:  Physical Exam Vitals reviewed. Exam conducted with a chaperone present.  Constitutional:      General: She is not in acute distress.    Appearance: Normal appearance. She is well-developed. She is obese.  HENT:     Head: Normocephalic and atraumatic.     Right Ear: Hearing, tympanic membrane, ear canal and external ear normal. There is no impacted cerumen.     Left Ear: Hearing, tympanic membrane, ear canal and external ear normal. There is no impacted cerumen.     Nose: Nose normal.     Mouth/Throat:  Mouth: Mucous membranes are moist.  Eyes:     General: Lids are normal.     Extraocular Movements: Extraocular movements intact.     Conjunctiva/sclera: Conjunctivae normal.     Pupils: Pupils are equal, round, and reactive to light.     Funduscopic exam:    Right eye: No papilledema.        Left eye: No papilledema.  Neck:     Thyroid: No thyroid mass.     Vascular: No carotid bruit.  Cardiovascular:     Rate and Rhythm: Normal rate and regular rhythm.     Pulses: Normal pulses.     Heart sounds: Normal heart sounds. No murmur heard. Pulmonary:     Effort:  Pulmonary effort is normal.     Breath sounds: Normal breath sounds.  Chest:     Chest wall: No mass.  Breasts:    Tanner Score is 5.     Right: Normal. No mass or tenderness.     Left: Normal. No mass or tenderness.  Abdominal:     General: Abdomen is flat. Bowel sounds are normal. There is no distension.     Palpations: Abdomen is soft.     Tenderness: There is no abdominal tenderness.  Genitourinary:    General: Normal vulva.     Exam position: Lithotomy position.     Tanner stage (genital): 5.     Labia:        Right: No rash.        Left: No rash.      Vagina: Normal. No vaginal discharge.     Cervix: Normal.     Uterus: Normal.      Adnexa: Right adnexa normal and left adnexa normal.     Rectum: Normal. Guaiac result negative. No mass.  Musculoskeletal:        General: No swelling. Normal range of motion.     Cervical back: Full passive range of motion without pain, normal range of motion and neck supple.     Right lower leg: No edema.     Left lower leg: No edema.  Lymphadenopathy:     Upper Body:     Right upper body: No supraclavicular, axillary or pectoral adenopathy.     Left upper body: No supraclavicular, axillary or pectoral adenopathy.  Skin:    General: Skin is warm and dry.     Capillary Refill: Capillary refill takes less than 2 seconds.  Neurological:     General: No focal deficit present.     Mental Status: She is alert and oriented to person, place, and time.     Cranial Nerves: No cranial nerve deficit.     Sensory: No sensory deficit.  Psychiatric:        Mood and Affect: Mood normal.        Behavior: Behavior normal.        Thought Content: Thought content normal.        Judgment: Judgment normal.         Assessment And Plan:     Annual physical exam Behavior modifications discussed and diet history reviewed.   Pt will continue to exercise regularly and modify diet with low GI, plant based foods and decrease intake of processed foods.   Recommend intake of daily multivitamin, Vitamin D, and calcium.  Recommend for preventive screenings, as well as recommend immunizations that include influenza, TDAP  Encounter for Papanicolaou smear of cervix -     Cytology - PAP  Encounter for HIV (human immunodeficiency virus) test -     Hepatitis C antibody -     HIV Antibody (routine testing w rflx)  Encounter for hepatitis C screening test for low risk patient -     Hepatitis C antibody  Screening for STD (sexually transmitted disease) -     Hepatitis B surface antigen -     RPR -     HSV 1 and 2 Ab, IgG -     NuSwab Vaginitis Plus (VG+)  Encounter for screening for lipid disorder -     Lipid panel  COVID-19 vaccine series declined Declines covid 19 vaccine. Discussed risk of covid 50 and if she changes her mind about the vaccine to call the office. Education has been provided regarding the importance of this vaccine but patient still declined. Advised may receive this vaccine at local pharmacy or Health Dept.or vaccine clinic. Aware to provide a copy of the vaccination record if obtained from local pharmacy or Health Dept.  Encouraged to take multivitamin, vitamin d, vitamin c and zinc to increase immune system. Aware can call office if would like to have vaccine here at office. Verbalized acceptance and understanding.  Rheumatoid arthritis of other site, unspecified whether rheumatoid factor present (HCC) She has not been followed by a Rheumatologist in quite some time  -     CBC -     CMP14+EGFR -     Ambulatory referral to Rheumatology  Low iron Will check iron levels, pending labs will start iron supplement  -     Iron, TIBC and Ferritin Panel  Lump of left wrist Mobile lump to left wrist, likely ganglion cyst. Will refer to Hand specialist for evaluation  -     Ambulatory referral to Hand Surgery   Patient was given opportunity to ask questions. Patient verbalized understanding of the plan and was able to repeat  key elements of the plan. All questions were answered to their satisfaction.   Holly Felts, FNP   I, Holly Felts, FNP, have reviewed all documentation for this visit. The documentation on 09/01/22 for the exam, diagnosis, procedures, and orders are all accurate and complete.   THE PATIENT IS ENCOURAGED TO PRACTICE SOCIAL DISTANCING DUE TO THE COVID-19 PANDEMIC.

## 2022-09-03 LAB — HSV 1 AND 2 AB, IGG
HSV 1 Glycoprotein G Ab, IgG: 60.1 index — ABNORMAL HIGH (ref 0.00–0.90)
HSV 2 IgG, Type Spec: 12.9 index — ABNORMAL HIGH (ref 0.00–0.90)

## 2022-09-04 LAB — CMP14+EGFR
ALT: 14 IU/L (ref 0–32)
AST: 23 IU/L (ref 0–40)
Albumin/Globulin Ratio: 1.4 (ref 1.2–2.2)
Albumin: 4.2 g/dL (ref 3.9–4.9)
Alkaline Phosphatase: 65 IU/L (ref 44–121)
BUN/Creatinine Ratio: 9 (ref 9–23)
BUN: 10 mg/dL (ref 6–20)
Bilirubin Total: 0.2 mg/dL (ref 0.0–1.2)
CO2: 22 mmol/L (ref 20–29)
Calcium: 9.1 mg/dL (ref 8.7–10.2)
Chloride: 102 mmol/L (ref 96–106)
Creatinine, Ser: 1.06 mg/dL — ABNORMAL HIGH (ref 0.57–1.00)
Globulin, Total: 3 g/dL (ref 1.5–4.5)
Glucose: 73 mg/dL (ref 70–99)
Potassium: 4.2 mmol/L (ref 3.5–5.2)
Sodium: 139 mmol/L (ref 134–144)
Total Protein: 7.2 g/dL (ref 6.0–8.5)
eGFR: 69 mL/min/{1.73_m2} (ref >59)

## 2022-09-04 LAB — LIPID PANEL
Chol/HDL Ratio: 2.2 ratio (ref 0.0–4.4)
Cholesterol, Total: 199 mg/dL (ref 100–199)
HDL: 89 mg/dL (ref >39)
LDL Chol Calc (NIH): 102 mg/dL — ABNORMAL HIGH (ref 0–99)
Triglycerides: 42 mg/dL (ref 0–149)
VLDL Cholesterol Cal: 8 mg/dL (ref 5–40)

## 2022-09-04 LAB — IRON,TIBC AND FERRITIN PANEL
Ferritin: 13 ng/mL — ABNORMAL LOW (ref 15–150)
Iron Saturation: 15 % (ref 15–55)
Iron: 58 ug/dL (ref 27–159)
Total Iron Binding Capacity: 388 ug/dL (ref 250–450)
UIBC: 330 ug/dL (ref 131–425)

## 2022-09-04 LAB — CBC
Hematocrit: 38.4 % (ref 34.0–46.6)
Hemoglobin: 12.2 g/dL (ref 11.1–15.9)
MCH: 26.7 pg (ref 26.6–33.0)
MCHC: 31.8 g/dL (ref 31.5–35.7)
MCV: 84 fL (ref 79–97)
Platelets: 116 10*3/uL — ABNORMAL LOW (ref 150–450)
RBC: 4.57 x10E6/uL (ref 3.77–5.28)
RDW: 15.1 % (ref 11.7–15.4)
WBC: 4 10*3/uL (ref 3.4–10.8)

## 2022-09-04 LAB — HIV ANTIBODY (ROUTINE TESTING W REFLEX): HIV Screen 4th Generation wRfx: NONREACTIVE

## 2022-09-04 LAB — RPR: RPR Ser Ql: NONREACTIVE

## 2022-09-04 LAB — HEPATITIS C ANTIBODY: Hep C Virus Ab: NONREACTIVE

## 2022-09-04 LAB — HEPATITIS B SURFACE ANTIGEN: Hepatitis B Surface Ag: NEGATIVE

## 2022-09-06 LAB — CYTOLOGY - PAP: Diagnosis: NEGATIVE

## 2022-09-06 LAB — NUSWAB VAGINITIS PLUS (VG+)
Atopobium vaginae: HIGH Score — AB
BVAB 2: HIGH Score — AB
Candida albicans, NAA: NEGATIVE
Candida glabrata, NAA: NEGATIVE
Chlamydia trachomatis, NAA: NEGATIVE
Megasphaera 1: HIGH Score — AB
Neisseria gonorrhoeae, NAA: NEGATIVE
Trich vag by NAA: NEGATIVE

## 2022-09-15 MED ORDER — METRONIDAZOLE 500 MG PO TABS: 500.0000 mg | ORAL_TABLET | Freq: Three times a day (TID) | ORAL | 0 refills | Status: AC

## 2022-10-05 ENCOUNTER — Encounter: Payer: Self-pay | Admitting: Orthopaedic Surgery

## 2022-10-05 ENCOUNTER — Ambulatory Visit (INDEPENDENT_AMBULATORY_CARE_PROVIDER_SITE_OTHER): Payer: Medicaid Other | Admitting: Orthopaedic Surgery

## 2022-10-05 DIAGNOSIS — M25532 Pain in left wrist: Secondary | ICD-10-CM

## 2022-10-05 DIAGNOSIS — M67432 Ganglion, left wrist: Secondary | ICD-10-CM | POA: Diagnosis not present

## 2022-10-05 MED ORDER — METHYLPREDNISOLONE ACETATE 40 MG/ML IJ SUSP
40.0000 mg | INTRAMUSCULAR | Status: AC | PRN
Start: 2022-10-05 — End: 2022-10-05
  Administered 2022-10-05: 40 mg

## 2022-10-05 MED ORDER — BUPIVACAINE HCL 0.5 % IJ SOLN
1.0000 mL | INTRAMUSCULAR | Status: AC | PRN
Start: 2022-10-05 — End: 2022-10-05
  Administered 2022-10-05: 1 mL

## 2022-10-05 MED ORDER — LIDOCAINE HCL 1 % IJ SOLN
1.0000 mL | INTRAMUSCULAR | Status: AC | PRN
Start: 2022-10-05 — End: 2022-10-05
  Administered 2022-10-05: 1 mL

## 2022-10-05 NOTE — Progress Notes (Signed)
Office Visit Note   Patient: Holly Sellers           Date of Birth: 06/20/1980           MRN: [id number] Visit Date: 10/05/2022              Requested by: Arnette Felts, FNP [address] [place],  [place] [address] PCP: Arnette Felts, FNP   Assessment & Plan: Visit Diagnoses:  1. Ganglion cyst of volar aspect of left wrist     Plan: Impression is left volar wrist ganglion cyst.  Disease process explained and treatment options were discussed.  Patient would like to try an aspiration and cortisone injection.  We will immobilize with left wrist brace for a week.  Follow-up as needed.  Follow-Up Instructions: No follow-ups on file.   Orders:  No orders of the defined types were placed in this encounter.  No orders of the defined types were placed in this encounter.     Procedures: Hand/UE Inj: L wrist for volar carpal ganglion on 10/05/2022 1:05 PM Indications: pain Details: 25 G needle Medications: 1 mL lidocaine 1 %; 1 mL bupivacaine 0.5 %; 40 mg methylPREDNISolone acetate 40 MG/ML Outcome: tolerated well, no immediate complications Patient was prepped and draped in the usual sterile fashion.       Clinical Data: No additional findings.   Subjective: Chief Complaint  Patient presents with   Left Wrist - Pain    HPI Patient is a 42 year old female comes in for evaluation of left wrist mass for 2 months.  Denies any injuries.  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  The mass causes her discomfort. Review of Systems  Constitutional: Negative.   HENT: Negative.    Eyes: Negative.   Respiratory: Negative.    Cardiovascular: Negative.   Endocrine: Negative.   Musculoskeletal: Negative.   Neurological: Negative.   Hematological: Negative.   Psychiatric/Behavioral: Negative.    All other systems reviewed and are negative.    Objective: Vital Signs: There were no vitals taken for this visit.  Physical Exam Vitals  and nursing note reviewed.  Constitutional:      Appearance: She is well-developed.  HENT:     Head: Atraumatic.     Nose: Nose normal.  Eyes:     Extraocular Movements: Extraocular movements intact.  Cardiovascular:     Pulses: Normal pulses.  Pulmonary:     Effort: Pulmonary effort is normal.  Abdominal:     Palpations: Abdomen is soft.  Musculoskeletal:     Cervical back: Neck supple.  Skin:    General: Skin is warm.     Capillary Refill: Capillary refill takes less than 2 seconds.  Neurological:     Mental Status: She is alert. Mental status is at baseline.  Psychiatric:        Behavior: Behavior normal.        Thought Content: Thought content normal.        Judgment: Judgment normal.    Ortho Exam Examination of the left wrist is consistent with a volar wrist ganglion cyst.  No neurovascular compromise to the hand. Specialty Comments:  No specialty comments available.  Imaging: No results found.   PMFS History: Patient Active Problem List   Diagnosis Date Noted   Ganglion cyst of volar aspect of left wrist 10/05/2022   Low iron 09/02/2022   Lump of left wrist 09/02/2022   Annual physical exam 09/02/2022   Rheumatoid  arthritis of other site, unspecified whether rheumatoid factor present (HCC) 05/04/2022   Past Medical History:  Diagnosis Date   Anemia    GERD (gastroesophageal reflux disease)     Family History  Problem Relation Age of Onset   Hypertension Mother    Diabetes Mother    Hyperlipidemia Mother    Anemia Sister    Diabetes Sister     Past Surgical History:  Procedure Laterality Date   CESAREAN SECTION     HERNIA REPAIR     Social History   Occupational History   Not on file  Tobacco Use   Smoking status: Never   Smokeless tobacco: Never  Vaping Use   Vaping Use: Never used  Substance and Sexual Activity   Alcohol use: Yes    Comment: occasional weekends   Drug use: Yes    Types: Marijuana   Sexual activity: Yes

## 2022-12-22 ENCOUNTER — Telehealth: Payer: Self-pay

## 2022-12-22 NOTE — Transitions of Care (Post Inpatient/ED Visit) (Signed)
   12/22/2022  Name: Holly Sellers MRN: [id number] DOB: 1980/08/04  Today's TOC FU Call Status: Today's TOC FU Call Status:: Unsuccessful Call (1st Attempt) Unsuccessful Call (1st Attempt) Date: 12/22/22  Attempted to reach the patient regarding the most recent Inpatient/ED visit.  Follow Up Plan: Additional outreach attempts will be made to reach the patient to complete the Transitions of Care (Post Inpatient/ED visit) call.   Signature  Lisabeth Devoid, [place]

## 2023-02-15 ENCOUNTER — Encounter: Payer: Medicaid Other | Admitting: Internal Medicine

## 2023-02-15 NOTE — Progress Notes (Deleted)
Office Visit Note  Patient: Holly Sellers             Date of Birth: 05-Apr-1981           MRN: [id number]             PCP: Arnette Felts, FNP Referring: Arnette Felts, FNP Visit Date: 02/15/2023 Occupation: @GUAROCC @  Subjective:  No chief complaint on file.   History of Present Illness: Holly Sellers is a 42 y.o. female ***     Activities of Daily Living:  Patient reports morning stiffness for *** {minute/hour:19697}.   Patient {ACTIONS;DENIES/REPORTS:21021675::"Denies"} nocturnal pain.  Difficulty dressing/grooming: {ACTIONS;DENIES/REPORTS:21021675::"Denies"} Difficulty climbing stairs: {ACTIONS;DENIES/REPORTS:21021675::"Denies"} Difficulty getting out of chair: {ACTIONS;DENIES/REPORTS:21021675::"Denies"} Difficulty using hands for taps, buttons, cutlery, and/or writing: {ACTIONS;DENIES/REPORTS:21021675::"Denies"}  No Rheumatology ROS completed.   PMFS History:  Patient Active Problem List   Diagnosis Date Noted   Ganglion cyst of volar aspect of left wrist 10/05/2022   Low iron 09/02/2022   Lump of left wrist 09/02/2022   Annual physical exam 09/02/2022   Rheumatoid arthritis of other site, unspecified whether rheumatoid factor present (HCC) 05/04/2022    Past Medical History:  Diagnosis Date   Anemia    GERD (gastroesophageal reflux disease)     Family History  Problem Relation Age of Onset   Hypertension Mother    Diabetes Mother    Hyperlipidemia Mother    Anemia Sister    Diabetes Sister    Past Surgical History:  Procedure Laterality Date   CESAREAN SECTION     HERNIA REPAIR     Social History   Social History Narrative   Not on file   Immunization History  Administered Date(s) Administered   Tdap 05/04/2022     Objective: Vital Signs: There were no vitals taken for this visit.   Physical Exam   Musculoskeletal Exam: ***  CDAI Exam: CDAI Score: -- Patient Global: --; Provider Global: -- Swollen: --; Tender: -- Joint Exam 02/15/2023    No joint exam has been documented for this visit   There is currently no information documented on the homunculus. Go to the Rheumatology activity and complete the homunculus joint exam.  Investigation: No additional findings.  Imaging: No results found.  Recent Labs: Lab Results  Component Value Date   WBC 4.0 09/02/2022   HGB 12.2 09/02/2022   PLT 116 (L) 09/02/2022   NA 139 09/02/2022   K 4.2 09/02/2022   CL 102 09/02/2022   CO2 22 09/02/2022   GLUCOSE 73 09/02/2022   BUN 10 09/02/2022   CREATININE 1.06 (H) 09/02/2022   BILITOT 0.2 09/02/2022   ALKPHOS 65 09/02/2022   AST 23 09/02/2022   ALT 14 09/02/2022   PROT 7.2 09/02/2022   ALBUMIN 4.2 09/02/2022   CALCIUM 9.1 09/02/2022    Speciality Comments: No specialty comments available.  Procedures:  No procedures performed Allergies: Patient has no known allergies.   Assessment / Plan:     Visit Diagnoses: No diagnosis found.  Orders: No orders of the defined types were placed in this encounter.  No orders of the defined types were placed in this encounter.   Face-to-face time spent with patient was *** minutes. Greater than 50% of time was spent in counseling and coordination of care.  Follow-Up Instructions: No follow-ups on file.   Fuller Plan, MD  Note - This record has been created using [employer].  Chart creation errors have been sought, but may not always  have been located. Such  creation errors do not reflect on  the standard of medical care.
